# Patient Record
Sex: Female | Born: 1995 | Race: Black or African American | Hispanic: No | Marital: Single | Smoking: Never smoker
Health system: Southern US, Community
[De-identification: ages and names within clinical notes are randomized; demographics above are authoritative.]

## PROBLEM LIST (undated history)

## (undated) DIAGNOSIS — J45909 Unspecified asthma, uncomplicated: Secondary | ICD-10-CM

## (undated) HISTORY — PX: ANKLE SURGERY: SHX546

---

## 2014-07-19 ENCOUNTER — Encounter (HOSPITAL_COMMUNITY): Payer: Self-pay | Admitting: Emergency Medicine

## 2014-07-19 ENCOUNTER — Emergency Department (HOSPITAL_COMMUNITY)
Admission: EM | Admit: 2014-07-19 | Discharge: 2014-07-19 | Disposition: A | Discharge status: Home / Self Care | Payer: BLUE CROSS/BLUE SHIELD | Source: Home / Self Care | Attending: Family Medicine | Admitting: Family Medicine

## 2014-07-19 DIAGNOSIS — R059 Cough, unspecified: Secondary | ICD-10-CM

## 2014-07-19 DIAGNOSIS — J029 Acute pharyngitis, unspecified: Secondary | ICD-10-CM

## 2014-07-19 DIAGNOSIS — R05 Cough: Secondary | ICD-10-CM

## 2014-07-19 HISTORY — DX: Unspecified asthma, uncomplicated: J45.909

## 2014-07-19 LAB — POCT RAPID STREP A: Streptococcus, Group A Screen (Direct): NEGATIVE

## 2014-07-19 NOTE — ED Provider Notes (Signed)
CSN: [id number]     Arrival date & time 07/19/14  1446 History   First MD Initiated Contact with Patient 07/19/14 1539     Chief Complaint  Patient presents with  . URI   (Consider location/radiation/quality/duration/timing/severity/associated sxs/prior Treatment) Patient is a 19 y.o. female presenting with URI.  URI Presenting symptoms: congestion, cough and sore throat   Presenting symptoms: no ear pain, no facial pain, no fatigue, no fever and no rhinorrhea   Severity:  Mild Onset quality:  Gradual Duration:  1 week Timing:  Constant Progression:  Improving Chronicity:  New Associated symptoms: headaches     Past Medical History  Diagnosis Date  . Asthma    History reviewed. No pertinent past surgical history. History reviewed. No pertinent family history. History  Substance Use Topics  . Smoking status: Never Smoker   . Smokeless tobacco: Not on file  . Alcohol Use: No   OB History    No data available     Review of Systems  Constitutional: Negative for fever and fatigue.  HENT: Positive for congestion and sore throat. Negative for ear pain and rhinorrhea.   Eyes: Negative.   Respiratory: Positive for cough.   Cardiovascular: Negative.   Gastrointestinal: Negative.   Musculoskeletal: Negative.   Skin: Negative.   Neurological: Positive for headaches.    Allergies  Codone  Home Medications   Prior to Admission medications   Not on File   BP 132/77 mmHg  Pulse 71  Temp(Src) 98.7 F (37.1 C) (Oral)  Resp 12  SpO2 100% Physical Exam  Constitutional: She is oriented to person, place, and time. She appears well-developed and well-nourished.  HENT:  Head: Normocephalic and atraumatic.  Right Ear: Hearing, tympanic membrane, external ear and ear canal normal.  Left Ear: Hearing, tympanic membrane, external ear and ear canal normal.  Nose: Nose normal.  Mouth/Throat: Uvula is midline and mucous membranes are normal. No oral lesions. No trismus in the  jaw. Posterior oropharyngeal erythema present. No oropharyngeal exudate, posterior oropharyngeal edema or tonsillar abscesses.    Eyes: Conjunctivae are normal. No scleral icterus.  Neck: Normal range of motion. Neck supple.  Cardiovascular: Normal rate, regular rhythm and normal heart sounds.   Pulmonary/Chest: Effort normal and breath sounds normal.  Musculoskeletal: Normal range of motion.  Lymphadenopathy:    She has no cervical adenopathy.  Neurological: She is alert and oriented to person, place, and time.  Skin: Skin is warm and dry. No rash noted. No erythema.  Psychiatric: She has a normal mood and affect. Her behavior is normal.  Nursing note and vitals reviewed.   ED Course  Procedures (including critical care time) Labs Review Labs Reviewed  POCT RAPID STREP A ([facility] CARE ONLY)    Imaging Review No results found.   MDM   1. Pharyngitis   2. Cough   Rapid strep negative. Specimen will be held for a 3 day culture and if results indicate the need for additional treatment, you will be notified by phone. Salt water gargles. Tylenol or ibuprofen as directed on packaging for discomfort.  Plenty of fluids and rest as needed.    Ria ClockJennifer Lee H Harshal [place], [place] 07/19/14 [phone]

## 2014-07-19 NOTE — ED Notes (Signed)
C/o  Productive cough.  Sore throat.   Runny nose.  post nasal drip.  Congestion. No fever.   Denies n/v/d.    Symptoms present x 1 wk.  Mild relief with otc meds.

## 2014-07-19 NOTE — Discharge Instructions (Signed)
Rapid strep negative. Specimen will be held for a 3 day culture and if results indicate the need for additional treatment, you will be notified by phone. Salt water gargles. Tylenol or ibuprofen as directed on packaging for discomfort.  Plenty of fluids and rest as needed.  Cough, Adult  A cough is a reflex that helps clear your throat and airways. It can help heal the body or may be a reaction to an irritated airway. A cough may only last 2 or 3 weeks (acute) or may last more than 8 weeks (chronic).  CAUSES Acute cough:  Viral or bacterial infections. Chronic cough:  Infections.  Allergies.  Asthma.  Post-nasal drip.  Smoking.  Heartburn or acid reflux.  Some medicines.  Chronic lung problems (COPD).  Cancer. SYMPTOMS   Cough.  Fever.  Chest pain.  Increased breathing rate.  High-pitched whistling sound when breathing (wheezing).  Colored mucus that you cough up (sputum). TREATMENT   A bacterial cough may be treated with antibiotic medicine.  A viral cough must run its course and will not respond to antibiotics.  Your caregiver may recommend other treatments if you have a chronic cough. HOME CARE INSTRUCTIONS   Only take over-the-counter or prescription medicines for pain, discomfort, or fever as directed by your caregiver. Use cough suppressants only as directed by your caregiver.  Use a cold steam vaporizer or humidifier in your bedroom or home to help loosen secretions.  Sleep in a semi-upright position if your cough is worse at night.  Rest as needed.  Stop smoking if you smoke. SEEK IMMEDIATE MEDICAL CARE IF:   You have pus in your sputum.  Your cough starts to worsen.  You cannot control your cough with suppressants and are losing sleep.  You begin coughing up blood.  You have difficulty breathing.  You develop pain which is getting worse or is uncontrolled with medicine.  You have a fever. MAKE SURE YOU:   Understand these  instructions.  Will watch your condition.  Will get help right away if you are not doing well or get worse. Document Released: 12/07/2010 Document Revised: 09/02/2011 Document Reviewed: 12/07/2010 [facility] Patient Information 2015 [place], [place]. This information is not intended to replace advice given to you by your health care provider. Make sure you discuss any questions you have with your health care provider.  Pharyngitis Pharyngitis is redness, pain, and swelling (inflammation) of your pharynx.  CAUSES  Pharyngitis is usually caused by infection. Most of the time, these infections are from viruses (viral) and are part of a cold. However, sometimes pharyngitis is caused by bacteria (bacterial). Pharyngitis can also be caused by allergies. Viral pharyngitis may be spread from person to person by coughing, sneezing, and personal items or utensils (cups, forks, spoons, toothbrushes). Bacterial pharyngitis may be spread from person to person by more intimate contact, such as kissing.  SIGNS AND SYMPTOMS  Symptoms of pharyngitis include:   Sore throat.   Tiredness (fatigue).   Low-grade fever.   Headache.  Joint pain and muscle aches.  Skin rashes.  Swollen lymph nodes.  Plaque-like film on throat or tonsils (often seen with bacterial pharyngitis). DIAGNOSIS  Your health care provider will ask you questions about your illness and your symptoms. Your medical history, along with a physical exam, is often all that is needed to diagnose pharyngitis. Sometimes, a rapid strep test is done. Other lab tests may also be done, depending on the suspected cause.  TREATMENT  Viral pharyngitis will usually  get better in 3-4 days without the use of medicine. Bacterial pharyngitis is treated with medicines that kill germs (antibiotics).  HOME CARE INSTRUCTIONS   Drink enough water and fluids to keep your urine clear or pale yellow.   Only take over-the-counter or prescription medicines as  directed by your health care provider:   If you are prescribed antibiotics, make sure you finish them even if you start to feel better.   Do not take aspirin.   Get lots of rest.   Gargle with 8 oz of salt water ( tsp of salt per 1 qt of water) as often as every 1-2 hours to soothe your throat.   Throat lozenges (if you are not at risk for choking) or sprays may be used to soothe your throat. SEEK MEDICAL CARE IF:   You have large, tender lumps in your neck.  You have a rash.  You cough up green, yellow-brown, or bloody spit. SEEK IMMEDIATE MEDICAL CARE IF:   Your neck becomes stiff.  You drool or are unable to swallow liquids.  You vomit or are unable to keep medicines or liquids down.  You have severe pain that does not go away with the use of recommended medicines.  You have trouble breathing (not caused by a stuffy nose). MAKE SURE YOU:   Understand these instructions.  Will watch your condition.  Will get help right away if you are not doing well or get worse. Document Released: 06/10/2005 Document Revised: 03/31/2013 Document Reviewed: 02/15/2013 [facility] Patient Information 2015 [place], [place]. This information is not intended to replace advice given to you by your health care provider. Make sure you discuss any questions you have with your health care provider.  Sore Throat A sore throat is pain, burning, irritation, or scratchiness of the throat. There is often pain or tenderness when swallowing or talking. A sore throat may be accompanied by other symptoms, such as coughing, sneezing, fever, and swollen neck glands. A sore throat is often the first sign of another sickness, such as a cold, flu, strep throat, or mononucleosis (commonly known as mono). Most sore throats go away without medical treatment. CAUSES  The most common causes of a sore throat include:  A viral infection, such as a cold, flu, or mono.  A bacterial infection, such as strep throat,  tonsillitis, or whooping cough.  Seasonal allergies.  Dryness in the air.  Irritants, such as smoke or pollution.  Gastroesophageal reflux disease (GERD). HOME CARE INSTRUCTIONS   Only take over-the-counter medicines as directed by your caregiver.  Drink enough fluids to keep your urine clear or pale yellow.  Rest as needed.  Try using throat sprays, lozenges, or sucking on hard candy to ease any pain (if older than 4 years or as directed).  Sip warm liquids, such as broth, herbal tea, or warm water with honey to relieve pain temporarily. You may also eat or drink cold or frozen liquids such as frozen ice pops.  Gargle with salt water (mix 1 tsp salt with 8 oz of water).  Do not smoke and avoid secondhand smoke.  Put a cool-mist humidifier in your bedroom at night to moisten the air. You can also turn on a hot shower and sit in the bathroom with the door closed for 5-10 minutes. SEEK IMMEDIATE MEDICAL CARE IF:  You have difficulty breathing.  You are unable to swallow fluids, soft foods, or your saliva.  You have increased swelling in the throat.  Your sore throat  does not get better in 7 days.  You have nausea and vomiting.  You have a fever or persistent symptoms for more than 2-3 days.  You have a fever and your symptoms suddenly get worse. MAKE SURE YOU:   Understand these instructions.  Will watch your condition.  Will get help right away if you are not doing well or get worse. Document Released: 07/18/2004 Document Revised: 05/27/2012 Document Reviewed: 02/16/2012 [facility] Patient Information 2015 [place], [place]. This information is not intended to replace advice given to you by your health care provider. Make sure you discuss any questions you have with your health care provider.  Salt Water Gargle This solution will help make your mouth and throat feel better. HOME CARE INSTRUCTIONS   Mix 1 teaspoon of salt in 8 ounces of warm water.  Gargle with this  solution as much or often as you need or as directed. Swish and gargle gently if you have any sores or wounds in your mouth.  Do not swallow this mixture. Document Released: 03/14/2004 Document Revised: 09/02/2011 Document Reviewed: 08/05/2008 [facility] Patient Information 2015 [place], [place]. This information is not intended to replace advice given to you by your health care provider. Make sure you discuss any questions you have with your health care provider.  Upper Respiratory Infection, Adult An upper respiratory infection (URI) is also sometimes known as the common cold. The upper respiratory tract includes the nose, sinuses, throat, trachea, and bronchi. Bronchi are the airways leading to the lungs. Most people improve within 1 week, but symptoms can last up to 2 weeks. A residual cough may last even longer.  CAUSES Many different viruses can infect the tissues lining the upper respiratory tract. The tissues become irritated and inflamed and often become very moist. Mucus production is also common. A cold is contagious. You can easily spread the virus to others by oral contact. This includes kissing, sharing a glass, coughing, or sneezing. Touching your mouth or nose and then touching a surface, which is then touched by another person, can also spread the virus. SYMPTOMS  Symptoms typically develop 1 to 3 days after you come in contact with a cold virus. Symptoms vary from person to person. They may include:  Runny nose.  Sneezing.  Nasal congestion.  Sinus irritation.  Sore throat.  Loss of voice (laryngitis).  Cough.  Fatigue.  Muscle aches.  Loss of appetite.  Headache.  Low-grade fever. DIAGNOSIS  You might diagnose your own cold based on familiar symptoms, since most people get a cold 2 to 3 times a year. Your caregiver can confirm this based on your exam. Most importantly, your caregiver can check that your symptoms are not due to another disease such as strep throat,  sinusitis, pneumonia, asthma, or epiglottitis. Blood tests, throat tests, and X-rays are not necessary to diagnose a common cold, but they may sometimes be helpful in excluding other more serious diseases. Your caregiver will decide if any further tests are required. RISKS AND COMPLICATIONS  You may be at risk for a more severe case of the common cold if you smoke cigarettes, have chronic heart disease (such as heart failure) or lung disease (such as asthma), or if you have a weakened immune system. The very young and very old are also at risk for more serious infections. Bacterial sinusitis, middle ear infections, and bacterial pneumonia can complicate the common cold. The common cold can worsen asthma and chronic obstructive pulmonary disease (COPD). Sometimes, these complications can require emergency  medical care and may be life-threatening. PREVENTION  The best way to protect against getting a cold is to practice good hygiene. Avoid oral or hand contact with people with cold symptoms. Wash your hands often if contact occurs. There is no clear evidence that vitamin C, vitamin E, echinacea, or exercise reduces the chance of developing a cold. However, it is always recommended to get plenty of rest and practice good nutrition. TREATMENT  Treatment is directed at relieving symptoms. There is no cure. Antibiotics are not effective, because the infection is caused by a virus, not by bacteria. Treatment may include:  Increased fluid intake. Sports drinks offer valuable electrolytes, sugars, and fluids.  Breathing heated mist or steam (vaporizer or shower).  Eating chicken soup or other clear broths, and maintaining good nutrition.  Getting plenty of rest.  Using gargles or lozenges for comfort.  Controlling fevers with ibuprofen or acetaminophen as directed by your caregiver.  Increasing usage of your inhaler if you have asthma. Zinc gel and zinc lozenges, taken in the first 24 hours of the common  cold, can shorten the duration and lessen the severity of symptoms. Pain medicines may help with fever, muscle aches, and throat pain. A variety of non-prescription medicines are available to treat congestion and runny nose. Your caregiver can make recommendations and may suggest nasal or lung inhalers for other symptoms.  HOME CARE INSTRUCTIONS   Only take over-the-counter or prescription medicines for pain, discomfort, or fever as directed by your caregiver.  Use a warm mist humidifier or inhale steam from a shower to increase air moisture. This may keep secretions moist and make it easier to breathe.  Drink enough water and fluids to keep your urine clear or pale yellow.  Rest as needed.  Return to work when your temperature has returned to normal or as your caregiver advises. You may need to stay home longer to avoid infecting others. You can also use a face mask and careful hand washing to prevent spread of the virus. SEEK MEDICAL CARE IF:   After the first few days, you feel you are getting worse rather than better.  You need your caregiver's advice about medicines to control symptoms.  You develop chills, worsening shortness of breath, or brown or red sputum. These may be signs of pneumonia.  You develop yellow or brown nasal discharge or pain in the face, especially when you bend forward. These may be signs of sinusitis.  You develop a fever, swollen neck glands, pain with swallowing, or white areas in the back of your throat. These may be signs of strep throat. SEEK IMMEDIATE MEDICAL CARE IF:   You have a fever.  You develop severe or persistent headache, ear pain, sinus pain, or chest pain.  You develop wheezing, a prolonged cough, cough up blood, or have a change in your usual mucus (if you have chronic lung disease).  You develop sore muscles or a stiff neck. Document Released: 12/04/2000 Document Revised: 09/02/2011 Document Reviewed: 09/15/2013 [facility] Patient  Information 2015 [place], [place]. This information is not intended to replace advice given to you by your health care provider. Make sure you discuss any questions you have with your health care provider.

## 2014-07-21 LAB — CULTURE, GROUP A STREP

## 2014-08-05 ENCOUNTER — Encounter (HOSPITAL_COMMUNITY): Payer: Self-pay | Admitting: Emergency Medicine

## 2014-08-05 ENCOUNTER — Emergency Department (HOSPITAL_COMMUNITY): Payer: BLUE CROSS/BLUE SHIELD

## 2014-08-05 ENCOUNTER — Emergency Department (HOSPITAL_COMMUNITY)
Admission: EM | Admit: 2014-08-05 | Discharge: 2014-08-05 | Disposition: A | Discharge status: Home / Self Care | Payer: BLUE CROSS/BLUE SHIELD | Attending: Emergency Medicine | Admitting: Emergency Medicine

## 2014-08-05 DIAGNOSIS — Z79899 Other long term (current) drug therapy: Secondary | ICD-10-CM | POA: Insufficient documentation

## 2014-08-05 DIAGNOSIS — Z3202 Encounter for pregnancy test, result negative: Secondary | ICD-10-CM | POA: Diagnosis not present

## 2014-08-05 DIAGNOSIS — R0602 Shortness of breath: Secondary | ICD-10-CM | POA: Diagnosis present

## 2014-08-05 DIAGNOSIS — R55 Syncope and collapse: Secondary | ICD-10-CM | POA: Insufficient documentation

## 2014-08-05 DIAGNOSIS — J45901 Unspecified asthma with (acute) exacerbation: Secondary | ICD-10-CM | POA: Insufficient documentation

## 2014-08-05 LAB — CBC
HCT: 40.2 % (ref 36.0–46.0)
HEMOGLOBIN: 13.1 g/dL (ref 12.0–15.0)
MCH: 30 pg (ref 26.0–34.0)
MCHC: 32.6 g/dL (ref 30.0–36.0)
MCV: 92 fL (ref 78.0–100.0)
Platelets: 313 10*3/uL (ref 150–400)
RBC: 4.37 MIL/uL (ref 3.87–5.11)
RDW: 13.3 % (ref 11.5–15.5)
WBC: 4.5 10*3/uL (ref 4.0–10.5)

## 2014-08-05 LAB — BASIC METABOLIC PANEL
Anion gap: 6 (ref 5–15)
BUN: 8 mg/dL (ref 6–23)
CALCIUM: 9.6 mg/dL (ref 8.4–10.5)
CO2: 25 mmol/L (ref 19–32)
Chloride: 106 mmol/L (ref 96–112)
Creatinine, Ser: 0.81 mg/dL (ref 0.50–1.10)
GFR calc non Af Amer: >90 mL/min (ref >90)
GLUCOSE: 73 mg/dL (ref 70–99)
POTASSIUM: 4 mmol/L (ref 3.5–5.1)
SODIUM: 137 mmol/L (ref 135–145)

## 2014-08-05 LAB — POC URINE PREG, ED: PREG TEST UR: NEGATIVE

## 2014-08-05 LAB — TROPONIN I

## 2014-08-05 LAB — D-DIMER, QUANTITATIVE (NOT AT ARMC)

## 2014-08-05 MED ORDER — PREDNISONE 20 MG PO TABS: ORAL_TABLET | ORAL | Status: AC

## 2014-08-05 MED ORDER — ALBUTEROL SULFATE (2.5 MG/3ML) 0.083% IN NEBU
5.0000 mg | INHALATION_SOLUTION | Freq: Once | RESPIRATORY_TRACT | Status: AC
  Administered 2014-08-05: 5 mg via RESPIRATORY_TRACT
  Filled 2014-08-05: qty 6

## 2014-08-05 MED ORDER — PREDNISONE 20 MG PO TABS
60.0000 mg | ORAL_TABLET | Freq: Once | ORAL | Status: AC
  Administered 2014-08-05: 60 mg via ORAL
  Filled 2014-08-05: qty 3

## 2014-08-05 MED ORDER — ALBUTEROL SULFATE HFA 108 (90 BASE) MCG/ACT IN AERS
2.0000 | INHALATION_SPRAY | Freq: Once | RESPIRATORY_TRACT | Status: AC
  Administered 2014-08-05: 2 via RESPIRATORY_TRACT
  Filled 2014-08-05: qty 6.7

## 2014-08-05 NOTE — ED Notes (Signed)
ATTEMPTED TO DRAW LABS UNSUCCESSFUL X 1 RN MADE AWARE

## 2014-08-05 NOTE — ED Provider Notes (Signed)
CSN: [id number]     Arrival date & time 08/05/14  0820 History   First MD Initiated Contact with Patient 08/05/14 [phone]     Chief Complaint  Patient presents with  . Shortness of Breath     (Consider location/radiation/quality/duration/timing/severity/associated sxs/prior Treatment) The history is provided by the patient. No language interpreter was used.  Kaitlin Bauer is a 19 y/o F with PMHx of asthma presenting to the ED with shortness of breath and syncopal episode that occurred this morning when the patient was running and conditioning with her team. Patient reported that since the end of January she has been dealing with an URI. Reported that for the past couple of weeks she has been having nasal congestion continuously blowing her nose, chest congestion, and nonproductive cough. Reported that while she was running and conditioning she started to feel short of breath - as if air was not going in and out of her lungs. Stated that she laid up against the wall and felt generalized weakness. Reported that she did fall to the ground - according to friend that accompanied patient reported that she syncopized and that her eyes were fluttering. Friend reported that this lasted for a couple of minutes. Patient stated that when she awoken she knew what was going on around her. Reported that she has been experiencing left sided chest pain described as a pressure. Reported that she is on Depo shots. Denied head injury, headache, blurred vision, sudden loss of vision, numbness, tingling, abdominal pain, nausea, vomiting, neck pain, neck stiffness, fever, hemoptysis, leg swelling, travels, recent surgery. PCP none  Past Medical History  Diagnosis Date  . Asthma    Past Surgical History  Procedure Laterality Date  . Ankle surgery Left    History reviewed. No pertinent family history. History  Substance Use Topics  . Smoking status: Never Smoker   . Smokeless tobacco: Not on file  . Alcohol Use: No     OB History    No data available     Review of Systems  Constitutional: Positive for chills. Negative for fever.  Eyes: Negative for visual disturbance.  Respiratory: Positive for cough, chest tightness and shortness of breath.   Cardiovascular: Negative for chest pain.  Gastrointestinal: Negative for nausea, vomiting and abdominal pain.  Neurological: Positive for syncope. Negative for dizziness and weakness.      Allergies  Codone  Home Medications   Prior to Admission medications   Medication Sig Start Date End Date Taking? Authorizing Provider  albuterol (PROVENTIL HFA;VENTOLIN HFA) 108 (90 BASE) MCG/ACT inhaler Inhale 2 puffs into the lungs daily as needed for wheezing or shortness of breath.   Yes Historical Provider, MD  cetirizine (ZYRTEC) 10 MG tablet Take 10 mg by mouth daily as needed for allergies or rhinitis.   Yes Historical Provider, MD  medroxyPROGESTERone (DEPO-PROVERA) 150 MG/ML injection Inject 150 mg into the muscle every 3 (three) months. Last Shot - 06/27/2014   Yes Historical Provider, MD  predniSONE (DELTASONE) 20 MG tablet 3 tabs po day one, then 2 tabs daily x 4 days 08/05/14   Zahriah Roes, PA-C   BP 123/72 mmHg  Pulse 80  Temp(Src) 98.3 F (36.8 C) (Oral)  Resp 20  Wt 140 lb (63.504 kg)  SpO2 100%  LMP  Physical Exam  Constitutional: She is oriented to person, place, and time. She appears well-developed and well-nourished. No distress.  HENT:  Head: Normocephalic and atraumatic.  Mouth/Throat: Oropharynx is clear and moist. No oropharyngeal exudate.  Eyes: Conjunctivae and EOM are normal. Pupils are equal, round, and reactive to light. Right eye exhibits no discharge. Left eye exhibits no discharge.  Neck: Normal range of motion. Neck supple. No tracheal deviation present.  Negative neck stiffness Negative nuchal rigidity Negative cervical lymphadenopathy Negative meningeal signs  Cardiovascular: Normal rate, regular rhythm and normal  heart sounds.   Pulses:      Radial pulses are 2+ on the right side, and 2+ on the left side.       Dorsalis pedis pulses are 2+ on the right side, and 2+ on the left side.  Cap refill less than 3 seconds Negative swelling or pitting edema identified to the lower extremities bilaterally  Pulmonary/Chest: Effort normal. No respiratory distress. She has no wheezes. She has no rales. She exhibits tenderness (Discomfort upon palpation to the left side of the chest-reproducible upon palpation). She exhibits no mass, no bony tenderness, no laceration, no crepitus, no edema, no deformity, no swelling and no retraction.    Decreased breath sounds to upper lobes bilaterally Patient is able to speak in full sentences without difficulty Negative use of accessory muscles Negative stridor  Musculoskeletal: Normal range of motion.  Lymphadenopathy:    She has no cervical adenopathy.  Neurological: She is alert and oriented to person, place, and time. No cranial nerve deficit. She exhibits normal muscle tone. Coordination normal.  Skin: Skin is warm and dry. No rash noted. She is not diaphoretic. No erythema.  Psychiatric: She has a normal mood and affect. Her behavior is normal. Thought content normal.  Nursing note and vitals reviewed.   ED Course  Procedures (including critical care time)  Results for orders placed or performed during the hospital encounter of 08/05/14  Troponin I  Result Value Ref Range   Troponin I <0.03 <0.031 ng/mL  D-dimer, quantitative  Result Value Ref Range   D-Dimer, Quant <0.27 0.00 - 0.48 ug/mL-FEU  CBC  Result Value Ref Range   WBC 4.5 4.0 - 10.5 K/uL   RBC 4.37 3.87 - 5.11 MIL/uL   Hemoglobin 13.1 12.0 - 15.0 g/dL   HCT 16.1 09.6 - 04.5 %   MCV 92.0 78.0 - 100.0 fL   MCH 30.0 26.0 - 34.0 pg   MCHC 32.6 30.0 - 36.0 g/dL   RDW 40.9 81.1 - 91.4 %   Platelets 313 150 - 400 K/uL  Basic metabolic panel  Result Value Ref Range   Sodium 137 135 - 145 mmol/L    Potassium 4.0 3.5 - 5.1 mmol/L   Chloride 106 96 - 112 mmol/L   CO2 25 19 - 32 mmol/L   Glucose, Bld 73 70 - 99 mg/dL   BUN 8 6 - 23 mg/dL   Creatinine, Ser 7.82 0.50 - 1.10 mg/dL   Calcium 9.6 8.4 - 95.6 mg/dL   GFR calc non Af Amer >90 >90 mL/min   GFR calc Af Amer >90 >90 mL/min   Anion gap 6 5 - 15  POC urine preg, ED (not at [facility])  Result Value Ref Range   Preg Test, Ur NEGATIVE NEGATIVE    Labs Review Labs Reviewed  TROPONIN I  D-DIMER, QUANTITATIVE  CBC  BASIC METABOLIC PANEL  POC URINE PREG, ED    Imaging Review Dg Chest 2 View  08/05/2014   CLINICAL DATA:  19 year old with cough and left-sided chest pain.  EXAM: CHEST  2 VIEW  COMPARISON:  None.  FINDINGS: The heart size and mediastinal contours are within normal  limits. Both lungs are clear. The visualized skeletal structures are unremarkable.  IMPRESSION: No active cardiopulmonary disease.   Electronically Signed   By: Richarda Overlie M.D.   On: 08/05/2014 09:17     EKG Interpretation   Date/Time:  Friday August 05 2014 09:22:43 EST Ventricular Rate:  80 PR Interval:  162 QRS Duration: 81 QT Interval:  352 QTC Calculation: 406 R Axis:   49 Text Interpretation:  Sinus rhythm No prior for comparison Confirmed by  Gwendolyn Grant  MD, BLAIR ([id number]) on 08/05/2014 9:39:04 AM      Orthostatic VS for the past 24 hrs:  BP- Lying Pulse- Lying BP- Sitting Pulse- Sitting BP- Standing at 0 minutes Pulse- Standing at 0 minutes  08/05/14 0925 125/70 mmHg 97 125/79 mmHg 88 123/78 mmHg 83    10:37 AM This provider re-assessed the patient. Patient stated that she is feeling much better and breathing more easily.   10:44 AM patient ambulated with pulse ox check being 94-95% on room air. Patient denied shortness of breath for difficulty breathing with walking. Patient ready to be discharged home.   MDM   Final diagnoses:  Asthma exacerbation    Medications  albuterol (PROVENTIL HFA;VENTOLIN HFA) 108 (90 BASE) MCG/ACT inhaler 2  puff (not administered)  albuterol (PROVENTIL) (2.5 MG/3ML) 0.083% nebulizer solution 5 mg (5 mg Nebulization Given 08/05/14 0931)  predniSONE (DELTASONE) tablet 60 mg (60 mg Oral Given 08/05/14 1048)    Filed Vitals:   08/05/14 0820 08/05/14 0824 08/05/14 0931  BP:  123/72   Pulse:  80   Temp:  98.3 F (36.8 C)   TempSrc:  Oral   Resp:  20   Weight:  140 lb (63.504 kg)   SpO2: 98% 100% 100%   This provider reviewed patient's chart. Patient was seen and assessed in urgent care Center on 07/21/2014 regarding upper respiratory infection where patient was treated with supportive therapy. EKG noted sinus rhythm with a heart rate of 80 bpm. Troponin negative elevation. D-dimer negative elevation. CBC negative elevated leukocytosis. BMP unremarkable. Urine pregnancy negative. Chest x-ray no active cardio pulmonary disease noted. Doubt PE. Doubt ACS. Negative findings for pneumonia. Suspicion to be asthma exacerbation secondary to physical activity. Patient given nebulizer treatment as well as prednisone in ED setting. Pulse ox 100% on room air the entire stay. Patient ambulated well without difficulty or shortness of breath, pulse ox 95% on room air with ambulation. Patient feeling well. Patient stable, afebrile. Patient not septic appearing. Discharged patient. Referred patient to [facility] wellness Center. Discussed with patient to use albuterol as needed as well as prednisone. Discussed with patient to rest and stay hydrated. Discussed with patient to closely monitor symptoms and if symptoms are to worsen or change to report back to the ED - strict return instructions given.  Patient agreed to plan of care, understood, all questions answered.   Raymon Mutton, PA-C 08/05/14 1108  Elwin Mocha, MD 08/05/14 1540

## 2014-08-05 NOTE — ED Notes (Addendum)
Pt has been fighting respiratory symptoms for last 3 weeks. States went to gym today and while running could not catch her breath. Pt states has had a cough but non productive. Pt states painful to take a deep breath in.

## 2014-08-05 NOTE — Discharge Instructions (Signed)
Please call your doctor for a followup appointment within 24-48 hours. When you talk to your doctor please let them know that you were seen in the emergency department and have them acquire all of your records so that they can discuss the findings with you and formulate a treatment plan to fully care for your new and ongoing problems. Please call and set up an appointment with health and wellness Center Please take medications as prescribed and on a full stomach Please use albuterol inhaler as needed Please rest and stay hydrated Please avoid physical or strenuous activity for the next couple of days Please continue to monitor symptoms closely and if symptoms are to worsen or change (fever greater than 101, chills, sweating, nausea, vomiting, chest pain, shortness of breathe, difficulty breathing, weakness, numbness, tingling, worsening or changes to pain pattern, coughing up blood, running out of breath with walking or running) please report back to the Emergency Department immediately.    Asthma, Acute Bronchospasm Acute bronchospasm caused by asthma is also referred to as an asthma attack. Bronchospasm means your air passages become narrowed. The narrowing is caused by inflammation and tightening of the muscles in the air tubes (bronchi) in your lungs. This can make it hard to breathe or cause you to wheeze and cough. CAUSES Possible triggers are:  Animal dander from the skin, hair, or feathers of animals.  Dust mites contained in house dust.  Cockroaches.  Pollen from trees or grass.  Mold.  Cigarette or tobacco smoke.  Air pollutants such as dust, household cleaners, hair sprays, aerosol sprays, paint fumes, strong chemicals, or strong odors.  Cold air or weather changes. Cold air may trigger inflammation. Winds increase molds and pollens in the air.  Strong emotions such as crying or laughing hard.  Stress.  Certain medicines such as aspirin or beta-blockers.  Sulfites in  foods and drinks, such as dried fruits and wine.  Infections or inflammatory conditions, such as a flu, cold, or inflammation of the nasal membranes (rhinitis).  Gastroesophageal reflux disease (GERD). GERD is a condition where stomach acid backs up into your esophagus.  Exercise or strenuous activity. SIGNS AND SYMPTOMS   Wheezing.  Excessive coughing, particularly at night.  Chest tightness.  Shortness of breath. DIAGNOSIS  Your health care provider will ask you about your medical history and perform a physical exam. A chest X-ray or blood testing may be performed to look for other causes of your symptoms or other conditions that may have triggered your asthma attack. TREATMENT  Treatment is aimed at reducing inflammation and opening up the airways in your lungs. Most asthma attacks are treated with inhaled medicines. These include quick relief or rescue medicines (such as bronchodilators) and controller medicines (such as inhaled corticosteroids). These medicines are sometimes given through an inhaler or a nebulizer. Systemic steroid medicine taken by mouth or given through an IV tube also can be used to reduce the inflammation when an attack is moderate or severe. Antibiotic medicines are only used if a bacterial infection is present.  HOME CARE INSTRUCTIONS   Rest.  Drink plenty of liquids. This helps the mucus to remain thin and be easily coughed up. Only use caffeine in moderation and do not use alcohol until you have recovered from your illness.  Do not smoke. Avoid being exposed to secondhand smoke.  You play a critical role in keeping yourself in good health. Avoid exposure to things that cause you to wheeze or to have breathing problems.  Keep  your medicines up-to-date and available. Carefully follow your health care provider's treatment plan.  Take your medicine exactly as prescribed.  When pollen or pollution is bad, keep windows closed and use an air conditioner or  go to places with air conditioning.  Asthma requires careful medical care. See your health care provider for a follow-up as advised. If you are more than [redacted] weeks pregnant and you were prescribed any new medicines, let your obstetrician know about the visit and how you are doing. Follow up with your health care provider as directed.  After you have recovered from your asthma attack, make an appointment with your outpatient doctor to talk about ways to reduce the likelihood of future attacks. If you do not have a doctor who manages your asthma, make an appointment with a primary care doctor to discuss your asthma. SEEK IMMEDIATE MEDICAL CARE IF:   You are getting worse.  You have trouble breathing. If severe, call your local emergency services (911 in the U.[place].).  You develop chest pain or discomfort.  You are vomiting.  You are not able to keep fluids down.  You are coughing up yellow, green, brown, or bloody sputum.  You have a fever and your symptoms suddenly get worse.  You have trouble swallowing. MAKE SURE YOU:   Understand these instructions.  Will watch your condition.  Will get help right away if you are not doing well or get worse. Document Released: 09/25/2006 Document Revised: 06/15/2013 Document Reviewed: 12/16/2012 [facility] Patient Information 2015 [place], [place]. This information is not intended to replace advice given to you by your health care provider. Make sure you discuss any questions you have with your health care provider.   Emergency Department Resource Guide 1) Find a Doctor and Pay Out of Pocket Although you won't have to find out who is covered by your insurance plan, it is a good idea to ask around and get recommendations. You will then need to call the office and see if the doctor you have chosen will accept you as a new patient and what types of options they offer for patients who are self-pay. Some doctors offer discounts or will set up payment plans  for their patients who do not have insurance, but you will need to ask so you aren't surprised when you get to your appointment.  2) Contact Your Local Health Department Not all health departments have doctors that can see patients for sick visits, but many do, so it is worth a call to see if yours does. If you don't know where your local health department is, you can check in your phone book. The CDC also has a tool to help you locate your state's health department, and many state websites also have listings of all of their local health departments.  3) Find a Walk-in Clinic If your illness is not likely to be very severe or complicated, you may want to try a walk in clinic. These are popping up all over the country in pharmacies, drugstores, and shopping centers. They're usually staffed by nurse practitioners or physician assistants that have been trained to treat common illnesses and complaints. They're usually fairly quick and inexpensive. However, if you have serious medical issues or chronic medical problems, these are probably not your best option.  No Primary Care Doctor: - Call Health Connect at  [phone] - they can help you locate a primary care doctor that  accepts your insurance, provides certain services, etc. - Physician Referral Service- [phone]  Chronic Pain Problems: Organization         Address  Phone   Notes  [facility]  [phone] Patients need to be referred by their primary care doctor.   Medication Assistance: Organization         Address  Phone   Notes  [facility] Medication [facility] [address] [place] [place], [place] [address] [phone] --Must be a resident of [facility] -- Must have NO insurance coverage whatsoever (no Medicaid/ Medicare, etc.) -- The pt. MUST have a primary care doctor that directs their care regularly and follows them in the community   [facility]  [phone]   [employer]  [phone]    Agencies that provide inexpensive medical care: Organization         Address  Phone   Notes  [facility]  [phone]   [facility]    [phone]   [facility] [address] [place], [place] [address] [phone]   [facility] [address] [place] [place] [phone]   Planned Parenthood    [phone]   [facility]    [phone]   Community Health and [facility]  [address], [place] Phone:  [phone], Fax:  [phone] Hours of Operation:  9 am - 6 pm, M-F.  Also accepts Medicaid/Medicare and self-pay.  [facility]  [address], [place] Phone: [phone], Fax: [phone]. Hours of Operation:  8:30 am - 5:30 pm, M-F.  Also accepts Medicaid and self-pay.  [facility] [place] [address], [place] Phone: [phone]   [facility] [address] Natasha Bence [place], [place] [phone], Ext. [phone] Mondays & Thursdays: 7-9 AM.  First 15 patients are seen on a first come, first serve basis.    Medicaid-accepting [facility] Providers:  Organization         Address  Phone   Notes  [facility] [address], [place] [phone] Also accepts self-pay patients.  [facility] [address] Laurell Josephs [place], [place]  [phone]   [facility] [address], [place] [phone]   [facility] [address], [place] [phone]   Renaye Rakers [address], [place]   [phone] Only accepts [place] Access [place] patients after they have their name applied to their card.   Self-Pay (no insurance) in [facility]:  Organization         Address  Phone   Notes  Sickle Cell Patients, [facility] [address] [place], [place] [phone]   [facility] [address] [place], [place] [phone]   [facility] [place]  [address], [place] [phone]   [facility]/Dr. Osei-Bonsu  [address], [place] or [address], [place] [phone] Phone number for both [place] and [place] locations is the same.  [facility]gent Medical and [facility] [address], [place] [phone]   [facility] [address] [place] or [address] [phone]   [facility] [address], [place] [phone], phone; [phone], fax Sees  patients 1st and 3rd Saturday of every month.  Must not qualify for public or private insurance (i.e. Medicaid, Medicare, [place] Health Choice, Veterans' Benefits)  Household income should be no more than 200% of the poverty level The clinic cannot treat you if you are pregnant or think you are pregnant  Sexually transmitted diseases are not treated at the clinic.    Dental Care: Organization         Address  Phone  Notes  [facility] [address], [place] [phone] Accepts children up to age 21 who are enrolled in [place] or [place] Health Choice; pregnant women with a Medicaid card; and children who have applied for Medicaid or [place] Health Choice, but were declined, whose parents can pay a reduced fee at time of service.  [facility] [place]  [address], [place] [phone] Accepts children up to age 10521 who are enrolled in [place] or [place] Health Choice; pregnant women with a Medicaid card; and children who have applied for Medicaid or [place] Health Choice, but were declined, whose parents can pay a reduced fee at time of service.  [facility]  [address], [place] [phone] Patients are  seen by appointment only. Walk-ins are not accepted. [facility] will see patients 21 years of age and older. Monday - Tuesday (8am-5pm) Most Wednesdays (8:30-5pm) $30 per visit, cash only  [facility]  [address], [facility] [place] [phone] Patients are seen by appointment only. Walk-ins are not accepted. [facility] will see patients 21 years of age and older. One Wednesday Evening (Monthly: Volunteer Based).  $30 per visit, cash only  [employer] [facility]  [phone]) [phone] for adults; Children under age 384, call Graduate Pediatric Dentistry at [phone]. Children aged 294-14, please call [phone]) [phone] to request a pediatric application.  Dental services are provided in all areas of dental care including fillings, crowns and bridges, complete and partial dentures, implants, gum treatment, root canals, and extractions. Preventive care is also provided. Treatment is provided to both adults and children. Patients are selected via a lottery and there is often a waiting list.   [facility] [address], [place]  [phone] [email or web address]   [facility] [address], [place], [place] [phone], Ext. [phone] Second and Fourth Thursday of each month, opens at 6:30 AM; Clinic ends at 9 AM.  Patients are seen on a first-come first-served basis, and a limited number are seen during each clinic.   [facility]  [address], [place], [place] [phone]   Eligibility Requirements You must have lived in [place], [place], or [place] counties for at least the last three months.   You cannot be eligible for state or federal sponsored [place] Cityhealthcare insurance, including CIGNAVeterans Administration, [place]icaid, or [employer]icare.   You generally cannot be eligible for healthcare insurance through your employer.    How to apply: Eligibility screenings are held every Tuesday and Wednesday afternoon from 1:00 pm until 4:00  pm. You do not need an appointment for the interview!  [facility] [address], [place], [place] [phone]   [facility]  [phone]   [facility]  [phone]   [facility]  [phone]    Behavioral Health Resources in the Community: Intensive Outpatient Programs  Organization         Address  Phone  Notes  [facility] [address], [place], [place] [phone]   [facility] [address] [place], [place] [phone]   [facility] [address] [place], [place]  [phone]   [facility] Mental Health [address],  [place], [place] [phone] or [phone]   Substance Abuse Resources Organization         Address  Phone  Notes  [facility]  [phone]   [facility]  [phone]   [facility] [place]  [phone]   Floydene Flock  [phone]   Residential & Outpatient Substance Abuse Program  [phone]   Psychological Services Organization         Address  Phone  Notes  [facility] Health  [phone]   [facility]  [phone]   [facility] Mental Health [address], [place] [phone] or [phone]    Mobile Crisis Teams Organization         Address  Phone  Notes  [facility], Mobile Crisis Care Unit  [phone]   [facility]  [address] [place], [place] [phone]   Doristine Locks [address] [place] [phone]    Self-Help/Support Groups Organization         Address  Phone             Notes  [facility] [place] - variety of support groups  [phone]- I7437963 Call for more information  Narcotics Anonymous (NA), [facility] [address], [employer] [place]  2 meetings at this location   Statistician          Address  Phone  Notes  [facility] [address],    [place]  [phone]   [facility]  [address], [place] [address], [place], [place] [phone]   [facility] [address] [place], [place] [phone] Admissions: 8am-3pm M-F  [facility] [address],    [place], [place] [phone]   [facility] [address] [place], [place], [place] [phone]   [facility] [address]  [place], [place] [phone]   [facility] - Intensive Outpatient [address]urell Josephs [address], [place], [place] [phone]   [facility] ([facility] [address] [place]  [place], [place] [phone] or [phone]   [facility] ([facility]) [address], [place], [place] [phone] Accepts Medicaid  [facility] [address],  [place] [phone] Substance Abuse/Addiction Treatment   [facility] Organization         Address  Phone  Notes  [facility]  [phone]   Angie Fava, PhD [address] Ervin Knack [place], [place]   [phone] or [phone]   [facility]   [address] [place], [place] [phone]   [facility] [address], [place], [place] [phone] Insurance/Medicaid/sponsorship through [facility] [address]                                    [place], [place] [phone] Therapy/tele-psych/case  [facility] [address] [place] [phone]    Dr. Lolly Mustache  [phone]   [facility] Dept. 1) [address] [place] 2) [place] 3)  [address], [place] [phone]  [phone]   [place] [phone] or [phone] (After Hours)

## 2014-08-05 NOTE — ED Notes (Signed)
Pt able to ambulate without difficulty. Pt states "feeling much better". Patient oxygen sats 94%-96% on RA while ambulating.

## 2014-08-05 NOTE — ED Notes (Signed)
Bed: WA20 Expected date:  Expected time:  Means of arrival:  Comments: EMS-cold symptoms

## 2015-10-15 IMAGING — CR DG CHEST 2V
2 series · 2 of 2 positions shown · non-contrast
Comparison: None.

CLINICAL DATA: 18-year-old with cough and left-sided chest pain.

EXAM:
CHEST  2 VIEW

[w chest pa]
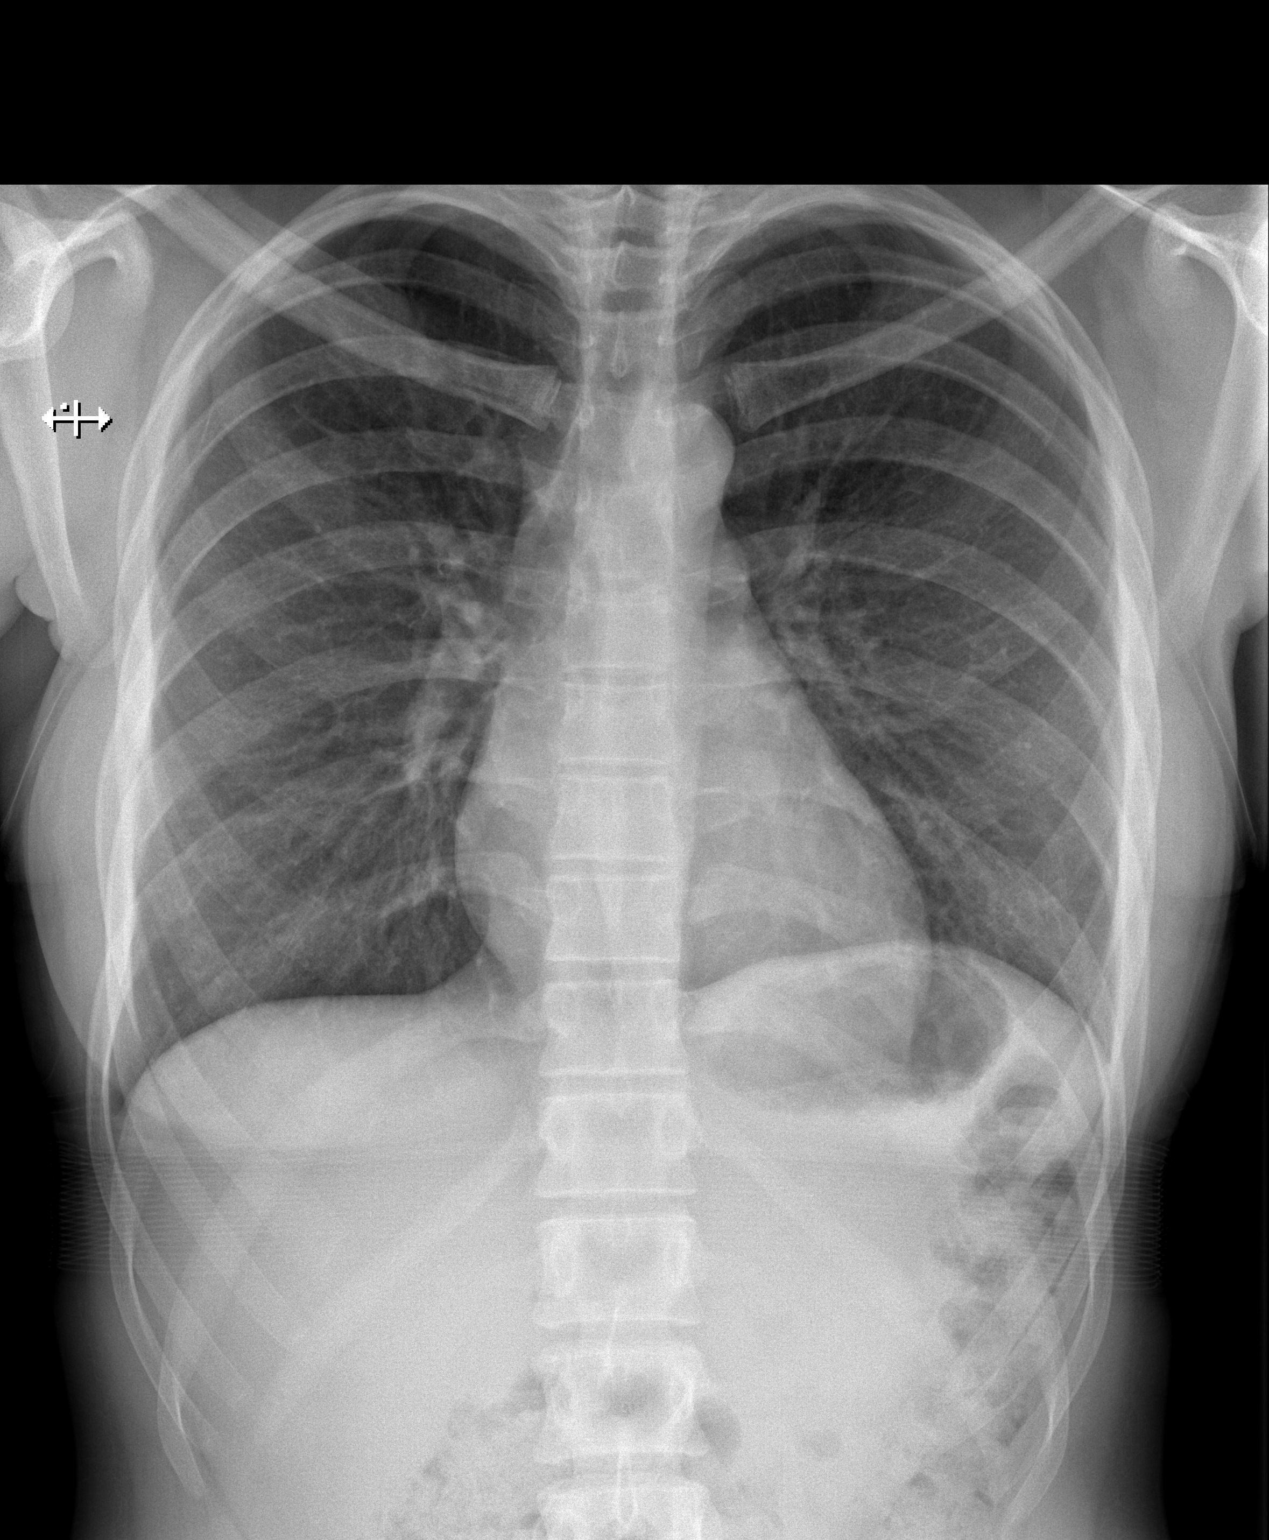

[w chest lat]
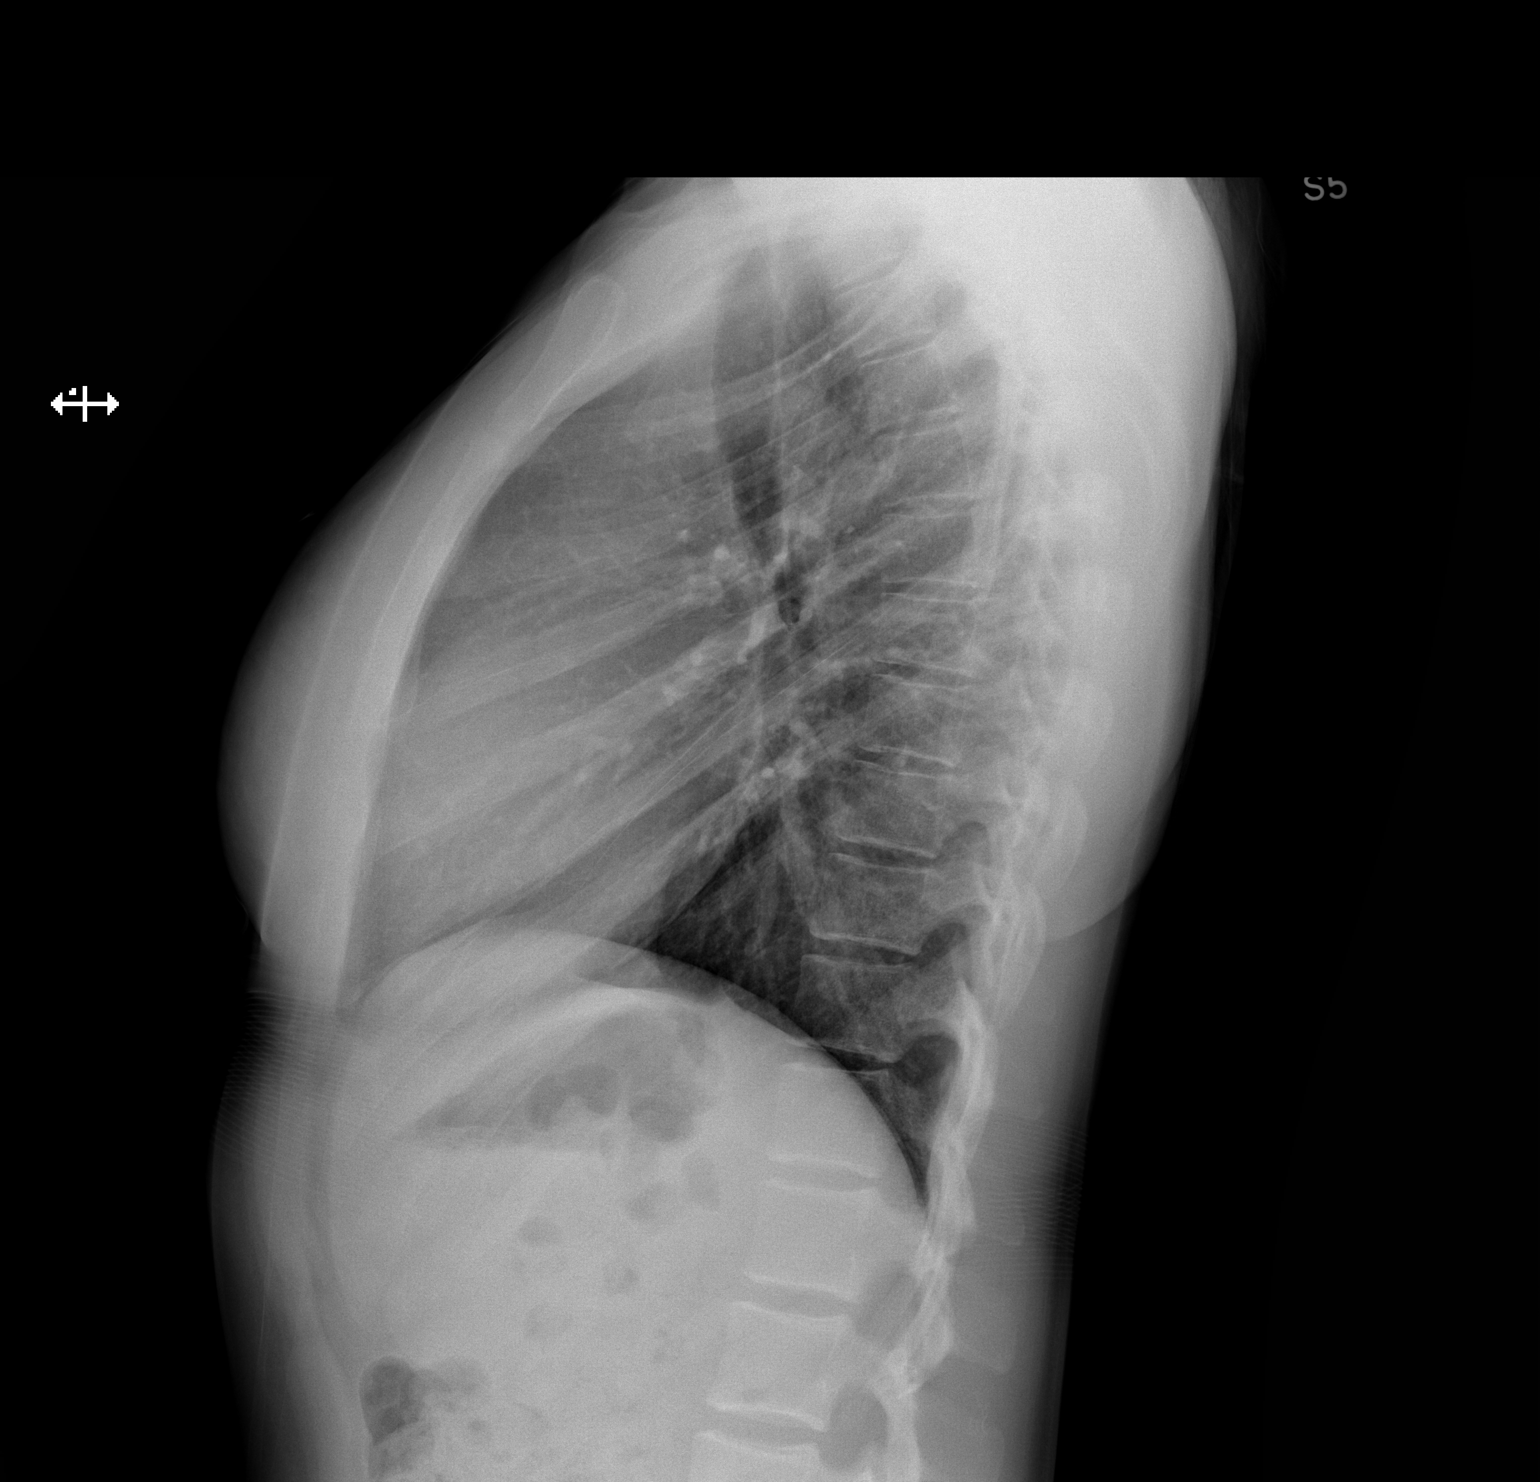

[2 of 2 positions shown; findings below may reference images not displayed]

FINDINGS: The heart size and mediastinal contours are within normal limits.
Both lungs are clear. The visualized skeletal structures are
unremarkable.
IMPRESSION: No active cardiopulmonary disease.
